# Patient Record
Sex: Male | Born: 2005
Health system: Southern US, Community
[De-identification: ages and names within clinical notes are randomized; demographics above are authoritative.]

## PROBLEM LIST (undated history)

## (undated) DIAGNOSIS — S42009A Fracture of unspecified part of unspecified clavicle, initial encounter for closed fracture: Secondary | ICD-10-CM

## (undated) HISTORY — PX: OTHER SURGICAL HISTORY: SHX169

## (undated) HISTORY — PX: HYPOSPADIAS CORRECTION: SHX483

## (undated) HISTORY — DX: Fracture of unspecified part of unspecified clavicle, initial encounter for closed fracture: S42.009A

---
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
## 2024-08-22 ENCOUNTER — Ambulatory Visit (INDEPENDENT_AMBULATORY_CARE_PROVIDER_SITE_OTHER): Admitting: Family Medicine

## 2024-08-22 ENCOUNTER — Encounter: Payer: Self-pay | Admitting: Family Medicine

## 2024-08-22 VITALS — BP 110/62 | HR 82 | Temp 98.9°F | Ht 64.41 in | Wt 177.6 lb

## 2024-08-22 DIAGNOSIS — Z0000 Encounter for general adult medical examination without abnormal findings: Secondary | ICD-10-CM | POA: Diagnosis not present

## 2024-08-22 DIAGNOSIS — Z7189 Other specified counseling: Secondary | ICD-10-CM

## 2024-08-22 NOTE — Patient Instructions (Signed)
 I would get a flu shot each fall.   Take care.  Glad to see you.  Update me as needed.

## 2024-08-22 NOTE — Progress Notes (Unsigned)
 CPE- See plan.  Routine anticipatory guidance given to patient.  See health maintenance.  The possibility exists that previously documented standard health maintenance information may have been brought forward from a previous encounter into this note.  If needed, that same information has been updated to reflect the current situation based on today's encounter.    Vaccines d/w pt.   Flu to be done at the pharmacy 2025.   Parents designated if patient were incapacitated.   Diet and exercise d/w pt.  Discussed safety, driving.  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    PMH and SH reviewed  Meds, vitals, and allergies reviewed.   ROS: Per HPI.  Unless specifically indicated otherwise in HPI, the patient denies:  General: fever. Eyes: acute vision changes ENT: sore throat Cardiovascular: chest pain Respiratory: SOB GI: vomiting GU: dysuria Musculoskeletal: acute back pain Derm: acute rash Neuro: acute motor dysfunction Psych: worsening mood Endocrine: polydipsia Heme: bleeding Allergy: hayfever  GEN: nad, alert and oriented HEENT: mucous membranes moist NECK: supple w/o LA CV: rrr. PULM: ctab, no inc wob ABD: soft, +bs EXT: no edema SKIN: no acute rash

## 2024-08-24 NOTE — Assessment & Plan Note (Signed)
?  Parents designated if patient were incapacitated.   ?

## 2024-08-24 NOTE — Assessment & Plan Note (Signed)
 Vaccines d/w pt.   Flu to be done at the pharmacy 2025.   Parents designated if patient were incapacitated.   Diet and exercise d/w pt.  Discussed safety, driving.  No etoh, illicits, tobacco

## 2024-11-14 ENCOUNTER — Encounter: Payer: Self-pay | Admitting: Family Medicine

## 2024-11-14 ENCOUNTER — Ambulatory Visit: Admitting: Family Medicine

## 2024-11-14 VITALS — BP 118/78 | HR 76 | Temp 98.0°F | Ht 64.0 in | Wt 188.4 lb

## 2024-11-14 DIAGNOSIS — H1030 Unspecified acute conjunctivitis, unspecified eye: Secondary | ICD-10-CM

## 2024-11-14 MED ORDER — POLYMYXIN B-TRIMETHOPRIM 10000-0.1 UNIT/ML-% OP SOLN: 1.0000 [drp] | Freq: Four times a day (QID) | OPHTHALMIC | 0 refills | Status: AC

## 2024-11-14 NOTE — Assessment & Plan Note (Signed)
 Likely pinkeye.  No FB seen, no FB exposure.  D/w pt about options, handwashing.  Use polytrim  in the right eye.  If  symptoms in the other eye, then use in both. Update me as needed. He agrees.  Routine cautions d/w pt.

## 2024-11-14 NOTE — Progress Notes (Signed)
 Lesions on the R upper eyelid, laterally.  Noted for a few days.  No sx like this prior.  Sore to the touch but not painful o/w.  No vision changes.  R eye crusting prev noted and cleaned off by patient.  No eye pain.  No trauma.  No L sided sx.  No ST.  No ear pain.  Using a heating pad in the meantime.  More puffy in the meantime.  Doesn't wear contacts.  Has glasses to wear at baseline.  No h/o FB exposure.    Meds, vitals, and allergies reviewed.   ROS: Per HPI unless specifically indicated in ROS section   Nad Ncat except for R upper lid puffy, lateral > medial, w/o fluctuant mass.  PERRL  EOMI R conjunctiva with limbus sparing injection.  No FB seen.   TM with wax B.  MMM and OP wnl.  Neck supple, no LA

## 2024-11-14 NOTE — Patient Instructions (Signed)
 Likely pinkeye.  Use the drops in the right eye.  If you have symptoms in the other eye, then use in both. Update me as needed.  Take care.  Glad to see you.

## 2024-11-17 ENCOUNTER — Ambulatory Visit
Admission: RE | Admit: 2024-11-17 | Discharge: 2024-11-17 | Disposition: A | Discharge status: Home / Self Care | Attending: Emergency Medicine

## 2024-11-17 VITALS — BP 108/68 | HR 83 | Temp 97.7°F | Resp 18

## 2024-11-17 DIAGNOSIS — L03213 Periorbital cellulitis: Secondary | ICD-10-CM

## 2024-11-17 DIAGNOSIS — H00011 Hordeolum externum right upper eyelid: Secondary | ICD-10-CM

## 2024-11-17 MED ORDER — AMOXICILLIN-POT CLAVULANATE 875-125 MG PO TABS: 1.0000 | ORAL_TABLET | Freq: Two times a day (BID) | ORAL | 0 refills | Status: DC

## 2024-11-17 NOTE — ED Triage Notes (Signed)
 Patient to Urgent Care with complaints of  right sided, upper eyelid swelling.   Symptoms x1 week. Using eye drops prescribed form his pcp with some relief in symptoms.

## 2024-11-17 NOTE — Discharge Instructions (Addendum)
 Take the Augmentin  as directed.  Continue using the eyedrops as directed.  Follow-up with your eye care provider tomorrow.  Go to the emergency department if you have worsening symptoms.
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
## 2024-12-24 ENCOUNTER — Ambulatory Visit
Admission: EM | Admit: 2024-12-24 | Discharge: 2024-12-24 | Disposition: A | Discharge status: Home / Self Care | Attending: Emergency Medicine | Admitting: Emergency Medicine

## 2024-12-24 DIAGNOSIS — L03213 Periorbital cellulitis: Secondary | ICD-10-CM

## 2024-12-24 MED ORDER — CEFDINIR 300 MG PO CAPS: 300.0000 mg | ORAL_CAPSULE | Freq: Two times a day (BID) | ORAL | 0 refills | Status: AC
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
## 2024-12-24 NOTE — Discharge Instructions (Signed)
 Take the cefdinir  as directed for the infection around your left eye.  Follow-up with your primary care provider next week.  Follow-up with your eye care provider next week.  Go to the emergency department if you have worsening symptoms.

## 2024-12-24 NOTE — ED Triage Notes (Addendum)
 Pt being seen in UC for redness and swelling in L eye that began this am. Pt reports having stye in R eye 1 month ago with similar presentation. Pt reports 6/10 pain in L eye. Pt reports using heat compress this am, with no relief. Pt denies blurry vision.
